# Patient Record
Sex: Female | Born: 1960 | Race: White | Hispanic: No | Marital: Married
Health system: Southern US, Community
[De-identification: ages and names within clinical notes are randomized; demographics above are authoritative.]

---

## 2001-01-26 ENCOUNTER — Other Ambulatory Visit: Admission: RE | Admit: 2001-01-26 | Discharge: 2001-01-26 | Payer: Self-pay | Admitting: Emergency Medicine

## 2001-10-11 ENCOUNTER — Encounter: Admission: RE | Admit: 2001-10-11 | Discharge: 2001-10-11 | Payer: Self-pay | Admitting: Family Medicine

## 2001-10-11 ENCOUNTER — Encounter: Payer: Self-pay | Admitting: Family Medicine

## 2003-01-11 ENCOUNTER — Other Ambulatory Visit: Admission: RE | Admit: 2003-01-11 | Discharge: 2003-01-11 | Payer: Self-pay | Admitting: Family Medicine

## 2003-07-14 ENCOUNTER — Ambulatory Visit (HOSPITAL_COMMUNITY): Admission: RE | Admit: 2003-07-14 | Discharge: 2003-07-14 | Payer: Self-pay | Admitting: Family Medicine

## 2005-12-16 ENCOUNTER — Other Ambulatory Visit: Admission: RE | Admit: 2005-12-16 | Discharge: 2005-12-16 | Payer: Self-pay | Admitting: Family Medicine

## 2006-06-01 ENCOUNTER — Ambulatory Visit (HOSPITAL_COMMUNITY): Admission: RE | Admit: 2006-06-01 | Discharge: 2006-06-01 | Payer: Self-pay | Admitting: Family Medicine

## 2007-10-20 ENCOUNTER — Ambulatory Visit (HOSPITAL_COMMUNITY): Admission: RE | Admit: 2007-10-20 | Discharge: 2007-10-20 | Payer: Self-pay | Admitting: Family Medicine

## 2007-12-08 ENCOUNTER — Other Ambulatory Visit: Admission: RE | Admit: 2007-12-08 | Discharge: 2007-12-08 | Payer: Self-pay | Admitting: Family Medicine

## 2009-05-18 ENCOUNTER — Other Ambulatory Visit: Admission: RE | Admit: 2009-05-18 | Discharge: 2009-05-18 | Payer: Self-pay | Admitting: Family Medicine

## 2010-05-22 ENCOUNTER — Ambulatory Visit (HOSPITAL_COMMUNITY): Admission: RE | Admit: 2010-05-22 | Discharge: 2010-05-22 | Payer: Self-pay | Admitting: Family Medicine

## 2010-07-09 ENCOUNTER — Other Ambulatory Visit
Admission: RE | Admit: 2010-07-09 | Discharge: 2010-07-09 | Payer: Self-pay | Source: Home / Self Care | Admitting: Family Medicine

## 2011-06-09 ENCOUNTER — Other Ambulatory Visit (HOSPITAL_COMMUNITY): Payer: Self-pay | Admitting: Family Medicine

## 2011-06-09 DIAGNOSIS — Z1231 Encounter for screening mammogram for malignant neoplasm of breast: Secondary | ICD-10-CM

## 2011-07-11 ENCOUNTER — Ambulatory Visit (HOSPITAL_COMMUNITY)
Admission: RE | Admit: 2011-07-11 | Discharge: 2011-07-11 | Disposition: A | Discharge status: Home / Self Care | Payer: 59 | Source: Ambulatory Visit | Attending: Family Medicine | Admitting: Family Medicine

## 2011-07-11 DIAGNOSIS — Z1231 Encounter for screening mammogram for malignant neoplasm of breast: Secondary | ICD-10-CM | POA: Insufficient documentation

## 2012-08-06 ENCOUNTER — Other Ambulatory Visit (HOSPITAL_COMMUNITY)
Admission: RE | Admit: 2012-08-06 | Discharge: 2012-08-06 | Disposition: A | Discharge status: Home / Self Care | Payer: 59 | Source: Ambulatory Visit | Attending: Family Medicine | Admitting: Family Medicine

## 2012-08-06 ENCOUNTER — Other Ambulatory Visit: Payer: Self-pay | Admitting: Family Medicine

## 2012-08-06 DIAGNOSIS — Z124 Encounter for screening for malignant neoplasm of cervix: Secondary | ICD-10-CM | POA: Insufficient documentation

## 2012-11-29 IMAGING — MG MM DIGITAL SCREENING BILAT
4 series · 4 of 4 positions shown · non-contrast
Comparison: Prior studies.

DG SCREEN MAMMOGRAM BILATERAL
Bilateral CC and MLO view(s) were taken.

DIGITAL SCREENING MAMMOGRAM WITH CAD:

[R CC]
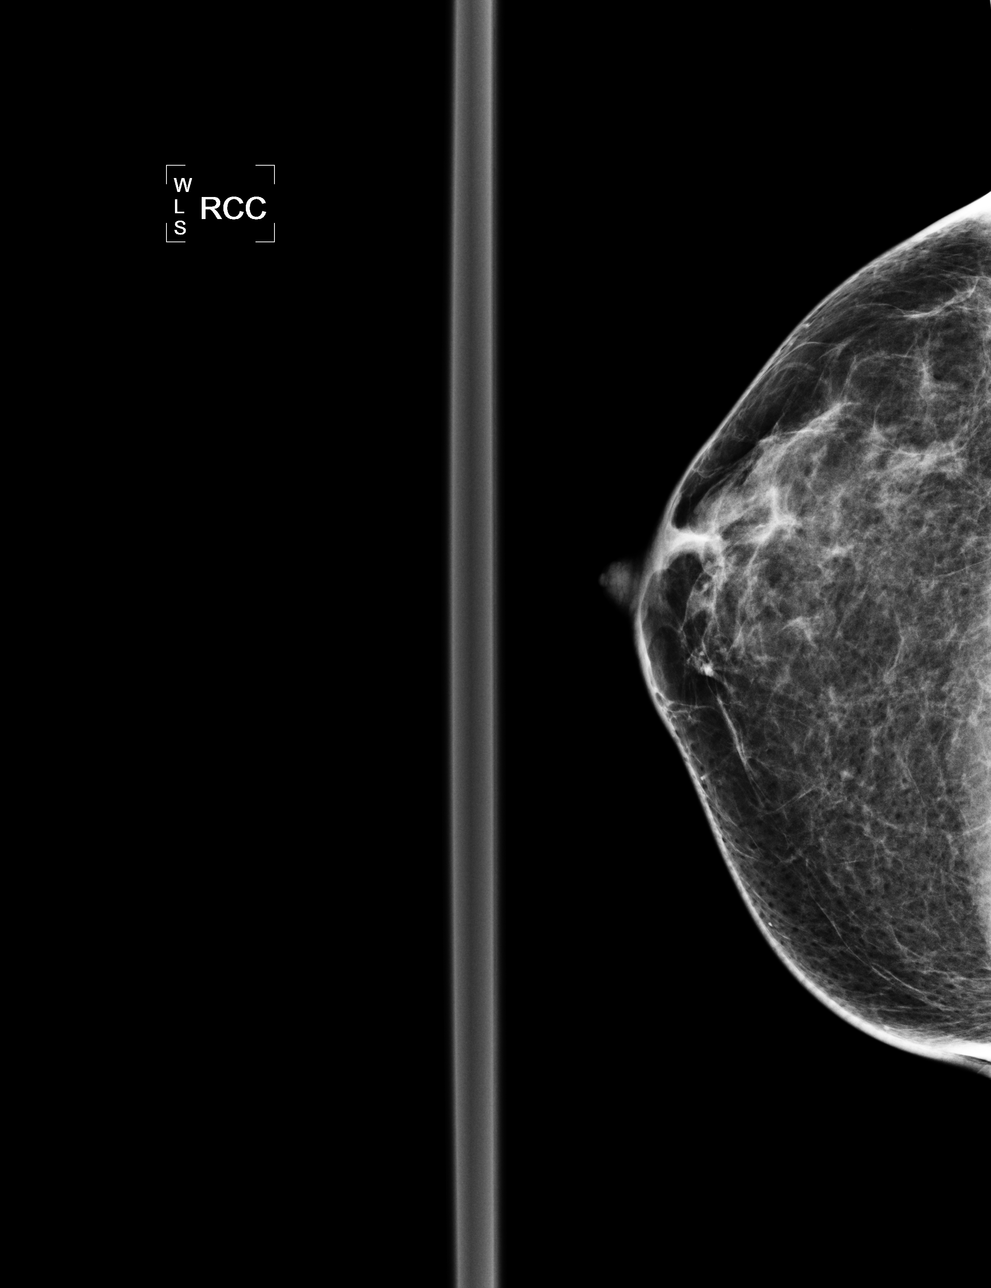

[R MLO]
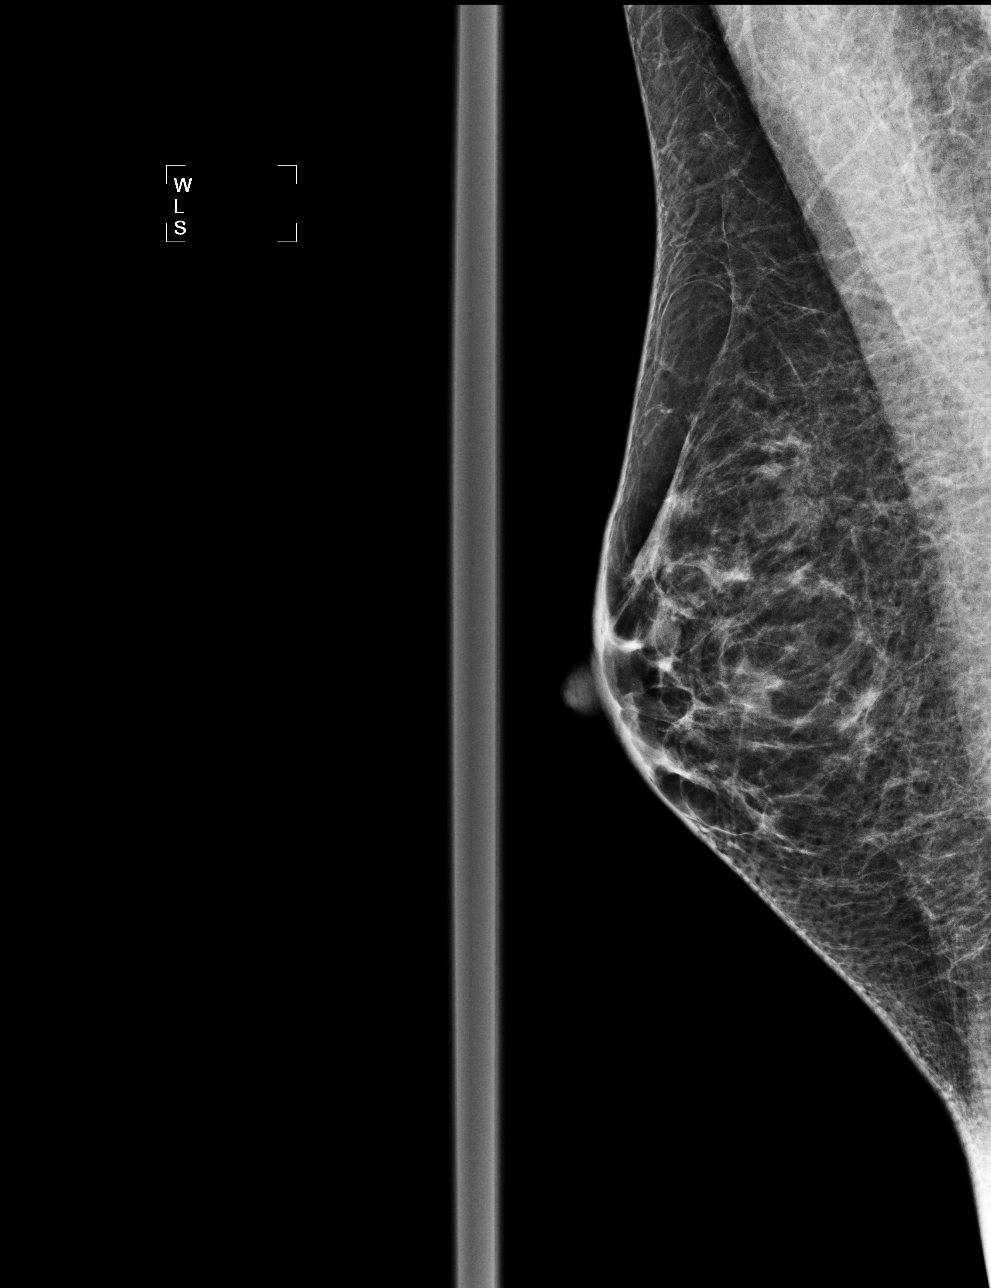

[L CC]
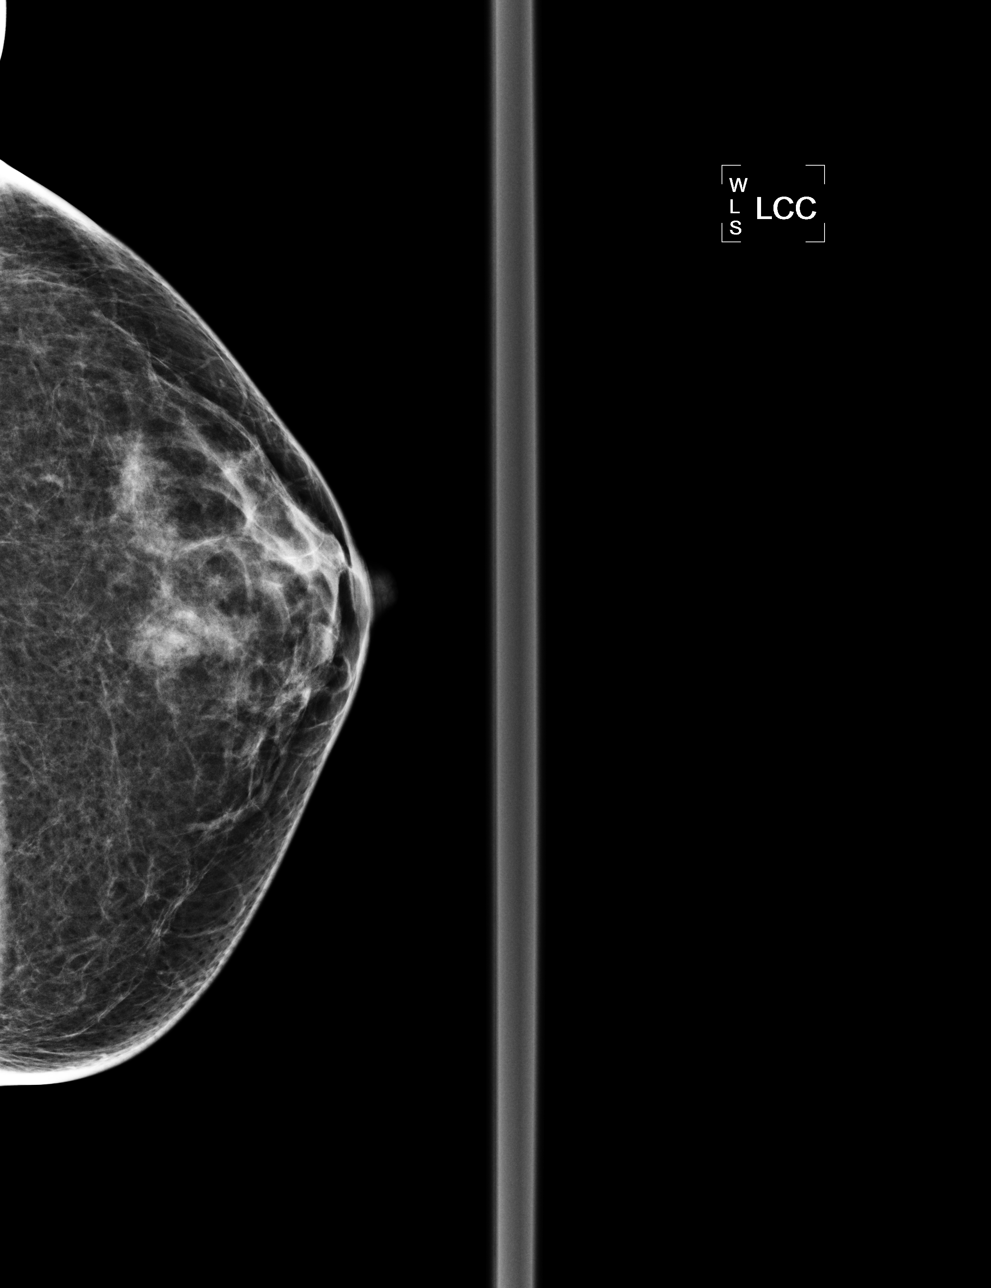

[L MLO]
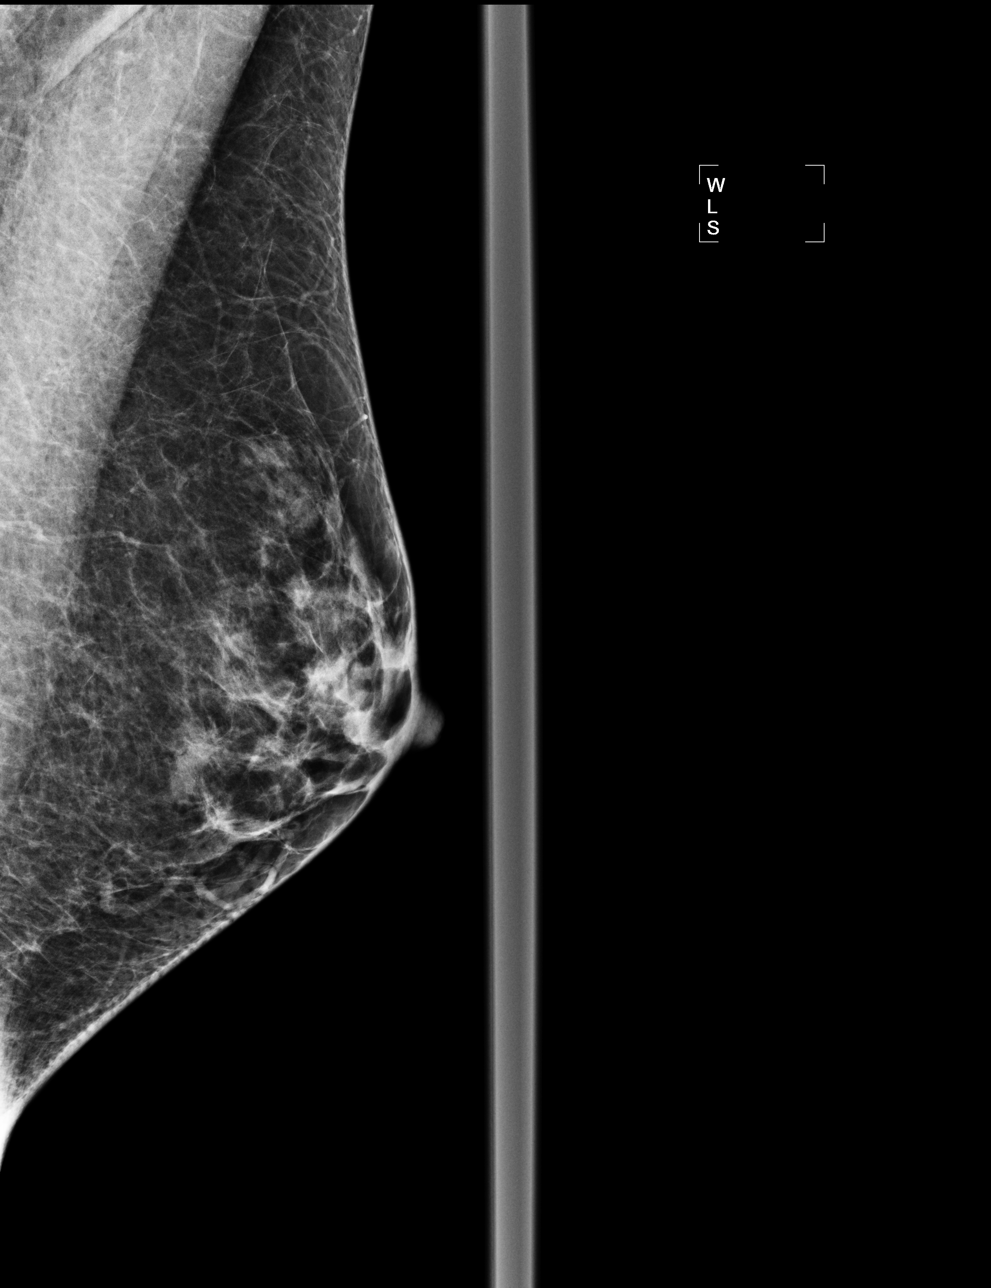

[4 of 4 positions shown; findings below may reference images not displayed]

There are scattered fibroglandular densities.  There is no dominant mass, architectural distortion 
or calcification to suggest malignancy.

Images were processed with CAD.
IMPRESSION: No mammographic evidence of malignancy.  Suggest yearly screening mammography.

A result letter of this screening mammogram will be mailed directly to the patient.

ASSESSMENT: Negative - BI-RADS 1

Screening mammogram in 1 year.
,

## 2013-10-12 ENCOUNTER — Other Ambulatory Visit (HOSPITAL_COMMUNITY): Payer: Self-pay | Admitting: Family Medicine

## 2013-10-12 DIAGNOSIS — Z1231 Encounter for screening mammogram for malignant neoplasm of breast: Secondary | ICD-10-CM

## 2013-10-14 ENCOUNTER — Ambulatory Visit (HOSPITAL_COMMUNITY)
Admission: RE | Admit: 2013-10-14 | Discharge: 2013-10-14 | Disposition: A | Discharge status: Home / Self Care | Payer: 59 | Source: Ambulatory Visit | Attending: Family Medicine | Admitting: Family Medicine

## 2013-10-14 DIAGNOSIS — Z1231 Encounter for screening mammogram for malignant neoplasm of breast: Secondary | ICD-10-CM | POA: Insufficient documentation

## 2015-02-27 ENCOUNTER — Other Ambulatory Visit (HOSPITAL_COMMUNITY): Payer: Self-pay | Admitting: Family Medicine

## 2015-02-27 DIAGNOSIS — Z1231 Encounter for screening mammogram for malignant neoplasm of breast: Secondary | ICD-10-CM

## 2015-03-06 ENCOUNTER — Ambulatory Visit (HOSPITAL_COMMUNITY)
Admission: RE | Admit: 2015-03-06 | Discharge: 2015-03-06 | Disposition: A | Discharge status: Home / Self Care | Payer: 59 | Source: Ambulatory Visit | Attending: Family Medicine | Admitting: Family Medicine

## 2015-03-06 ENCOUNTER — Other Ambulatory Visit (HOSPITAL_COMMUNITY): Payer: Self-pay | Admitting: Family Medicine

## 2015-03-06 DIAGNOSIS — Z1231 Encounter for screening mammogram for malignant neoplasm of breast: Secondary | ICD-10-CM

## 2015-09-26 ENCOUNTER — Other Ambulatory Visit (HOSPITAL_COMMUNITY)
Admission: RE | Admit: 2015-09-26 | Discharge: 2015-09-26 | Disposition: A | Discharge status: Home / Self Care | Payer: Managed Care, Other (non HMO) | Source: Ambulatory Visit | Attending: Family Medicine | Admitting: Family Medicine

## 2015-09-26 ENCOUNTER — Other Ambulatory Visit: Payer: Self-pay | Admitting: Family Medicine

## 2015-09-26 DIAGNOSIS — Z124 Encounter for screening for malignant neoplasm of cervix: Secondary | ICD-10-CM | POA: Diagnosis not present

## 2015-09-27 LAB — CYTOLOGY - PAP

## 2016-07-14 ENCOUNTER — Other Ambulatory Visit: Payer: Self-pay | Admitting: Family Medicine

## 2016-07-14 DIAGNOSIS — Z1231 Encounter for screening mammogram for malignant neoplasm of breast: Secondary | ICD-10-CM

## 2016-08-01 ENCOUNTER — Ambulatory Visit
Admission: RE | Admit: 2016-08-01 | Discharge: 2016-08-01 | Disposition: A | Discharge status: Home / Self Care | Payer: Managed Care, Other (non HMO) | Source: Ambulatory Visit | Attending: Family Medicine | Admitting: Family Medicine

## 2016-08-01 DIAGNOSIS — Z1231 Encounter for screening mammogram for malignant neoplasm of breast: Secondary | ICD-10-CM

## 2018-02-09 ENCOUNTER — Other Ambulatory Visit: Payer: Self-pay | Admitting: Family Medicine

## 2018-02-09 DIAGNOSIS — Z1231 Encounter for screening mammogram for malignant neoplasm of breast: Secondary | ICD-10-CM

## 2018-02-16 ENCOUNTER — Ambulatory Visit
Admission: RE | Admit: 2018-02-16 | Discharge: 2018-02-16 | Disposition: A | Discharge status: Home / Self Care | Payer: Managed Care, Other (non HMO) | Source: Ambulatory Visit | Attending: Family Medicine | Admitting: Family Medicine

## 2018-02-16 DIAGNOSIS — Z1231 Encounter for screening mammogram for malignant neoplasm of breast: Secondary | ICD-10-CM

## 2018-03-06 ENCOUNTER — Other Ambulatory Visit: Payer: Self-pay | Admitting: Family Medicine

## 2018-03-06 DIAGNOSIS — N9489 Other specified conditions associated with female genital organs and menstrual cycle: Secondary | ICD-10-CM

## 2018-03-11 ENCOUNTER — Ambulatory Visit
Admission: RE | Admit: 2018-03-11 | Discharge: 2018-03-11 | Disposition: A | Discharge status: Home / Self Care | Payer: 59 | Source: Ambulatory Visit | Attending: Family Medicine | Admitting: Family Medicine

## 2018-03-11 DIAGNOSIS — N9489 Other specified conditions associated with female genital organs and menstrual cycle: Secondary | ICD-10-CM

## 2019-04-12 ENCOUNTER — Other Ambulatory Visit: Payer: Self-pay | Admitting: Family Medicine

## 2019-04-12 DIAGNOSIS — Z1231 Encounter for screening mammogram for malignant neoplasm of breast: Secondary | ICD-10-CM

## 2019-05-05 ENCOUNTER — Other Ambulatory Visit: Payer: Self-pay | Admitting: Family Medicine

## 2019-05-05 ENCOUNTER — Other Ambulatory Visit (HOSPITAL_COMMUNITY)
Admission: RE | Admit: 2019-05-05 | Discharge: 2019-05-05 | Disposition: A | Discharge status: Home / Self Care | Payer: 59 | Source: Ambulatory Visit | Attending: Family Medicine | Admitting: Family Medicine

## 2019-05-05 DIAGNOSIS — Z124 Encounter for screening for malignant neoplasm of cervix: Secondary | ICD-10-CM | POA: Insufficient documentation

## 2019-05-09 LAB — CYTOLOGY - PAP
Comment: NEGATIVE
Diagnosis: NEGATIVE
High risk HPV: NEGATIVE

## 2019-05-25 ENCOUNTER — Ambulatory Visit
Admission: RE | Admit: 2019-05-25 | Discharge: 2019-05-25 | Disposition: A | Discharge status: Home / Self Care | Payer: 59 | Source: Ambulatory Visit | Attending: Family Medicine | Admitting: Family Medicine

## 2019-05-25 ENCOUNTER — Other Ambulatory Visit: Payer: Self-pay

## 2019-05-25 DIAGNOSIS — Z1231 Encounter for screening mammogram for malignant neoplasm of breast: Secondary | ICD-10-CM

## 2019-09-01 ENCOUNTER — Ambulatory Visit (INDEPENDENT_AMBULATORY_CARE_PROVIDER_SITE_OTHER): Payer: 59 | Admitting: Podiatry

## 2019-09-01 ENCOUNTER — Other Ambulatory Visit: Payer: Self-pay

## 2019-09-01 ENCOUNTER — Encounter: Payer: Self-pay | Admitting: Podiatry

## 2019-09-01 VITALS — BP 139/84 | HR 68 | Temp 97.2°F | Resp 16

## 2019-09-01 DIAGNOSIS — B070 Plantar wart: Secondary | ICD-10-CM | POA: Diagnosis not present

## 2019-09-01 NOTE — Patient Instructions (Signed)
Take dressing off in 8 hours and wash the foot with soap and water. If it is hurting or becomes uncomfortable before the 8 hours, go ahead and remove the bandage and wash the area.  If it blisters, apply antibiotic ointment and a band-aid.  Monitor for any signs/symptoms of infection. Call the office immediately if any occur or go directly to the emergency room. Call with any questions/concerns.   

## 2019-09-12 DIAGNOSIS — B070 Plantar wart: Secondary | ICD-10-CM | POA: Insufficient documentation

## 2019-09-12 NOTE — Progress Notes (Signed)
Subjective:   Patient ID: Tracy Duran, female   DOB: 59 y.o.   MRN: [id number]   HPI 59 year old female presents the office today for concerns of possible warts on her right foot which is been on the last 6 months.  She states it feels like she is walking on a rock.  She has had no recent treatment for the area.  Denies any swelling, redness.  No drainage.   Review of Systems  All other systems reviewed and are negative.  History reviewed. No pertinent past medical history.  History reviewed. No pertinent surgical history.  No current outpatient medications on file.  No Known Allergies  Social History   Socioeconomic History  . Marital status: Married    Spouse name: Not on file  . Number of children: Not on file  . Years of education: Not on file  . Highest education level: Not on file  Occupational History  . Not on file  Tobacco Use  . Smoking status: Unknown If Ever Smoked  . Smokeless tobacco: Never Used  Substance and Sexual Activity  . Alcohol use: Not on file  . Drug use: Not on file  . Sexual activity: Not on file  Other Topics Concern  . Not on file  Social History Narrative  . Not on file   Social Determinants of Health   Financial Resource Strain:   . Difficulty of Paying Living Expenses:   Food Insecurity:   . Worried About Charity fundraiser in the Last Year:   . Arboriculturist in the Last Year:   Transportation Needs:   . Film/video editor (Medical):   Marland Kitchen Lack of Transportation (Non-Medical):   Physical Activity:   . Days of Exercise per Week:   . Minutes of Exercise per Session:   Stress:   . Feeling of Stress :   Social Connections:   . Frequency of Communication with Friends and Family:   . Frequency of Social Gatherings with Friends and Family:   . Attends Religious Services:   . Active Member of Clubs or Organizations:   . Attends Archivist Meetings:   Marland Kitchen Marital Status:   Intimate Partner Violence:   . Fear of  Current or Ex-Partner:   . Emotionally Abused:   Marland Kitchen Physically Abused:   . Sexually Abused:        Objective:  Physical Exam  General: AAO x3, NAD  Dermatological: On the right foot submetatarsal areas hyperkeratotic lesion upon debridement appears to be a verruca.  There is no drainage or pus there is no surrounding erythema, ascending cellulitis.  No fluctuation crepitation.  There is no open lesions otherwise.  Vascular: Dorsalis Pedis artery and Posterior Tibial artery pedal pulses are 2/4 bilateral with immedate capillary fill time. There is no pain with calf compression, swelling, warmth, erythema.   Neruologic: Grossly intact via light touch bilateral.   Musculoskeletal: No gross boney pedal deformities bilateral. No pain, crepitus, or limitation noted with foot and ankle range of motion bilateral. Muscular strength 5/5 in all groups tested bilateral.  Gait: Unassisted, Nonantalgic.     Assessment:   Right foot verruca     Plan:  -Treatment options discussed including all alternatives, risks, and complications -Etiology of symptoms were discussed -Lesion was debrided utilizing the 312 with scalpel without any complications.  The area skin with alcohol and Cantharone was applied followed by an occlusive bandage.  Post procedure instructions discussed.  Monitor for any signs  or symptoms of infection.   Vivi Barrack DPM

## 2020-08-16 ENCOUNTER — Other Ambulatory Visit: Payer: Self-pay | Admitting: Family Medicine

## 2020-08-16 DIAGNOSIS — Z1231 Encounter for screening mammogram for malignant neoplasm of breast: Secondary | ICD-10-CM

## 2020-10-04 ENCOUNTER — Other Ambulatory Visit: Payer: Self-pay

## 2020-10-04 ENCOUNTER — Ambulatory Visit
Admission: RE | Admit: 2020-10-04 | Discharge: 2020-10-04 | Disposition: A | Discharge status: Home / Self Care | Payer: 59 | Source: Ambulatory Visit | Attending: Family Medicine | Admitting: Family Medicine

## 2020-10-04 DIAGNOSIS — Z1231 Encounter for screening mammogram for malignant neoplasm of breast: Secondary | ICD-10-CM

## 2021-06-06 ENCOUNTER — Other Ambulatory Visit: Payer: Self-pay | Admitting: Family Medicine

## 2021-06-06 DIAGNOSIS — Z1231 Encounter for screening mammogram for malignant neoplasm of breast: Secondary | ICD-10-CM

## 2021-06-06 DIAGNOSIS — E2839 Other primary ovarian failure: Secondary | ICD-10-CM

## 2021-11-20 ENCOUNTER — Ambulatory Visit
Admission: RE | Admit: 2021-11-20 | Discharge: 2021-11-20 | Disposition: A | Discharge status: Home / Self Care | Payer: 59 | Source: Ambulatory Visit | Attending: Family Medicine | Admitting: Family Medicine

## 2021-11-20 ENCOUNTER — Ambulatory Visit: Payer: 59

## 2021-11-20 DIAGNOSIS — E2839 Other primary ovarian failure: Secondary | ICD-10-CM

## 2021-11-20 DIAGNOSIS — Z1231 Encounter for screening mammogram for malignant neoplasm of breast: Secondary | ICD-10-CM

## 2021-11-22 ENCOUNTER — Other Ambulatory Visit: Payer: Self-pay | Admitting: Family Medicine

## 2021-11-22 DIAGNOSIS — R928 Other abnormal and inconclusive findings on diagnostic imaging of breast: Secondary | ICD-10-CM

## 2021-11-29 ENCOUNTER — Ambulatory Visit
Admission: RE | Admit: 2021-11-29 | Discharge: 2021-11-29 | Disposition: A | Discharge status: Home / Self Care | Payer: 59 | Source: Ambulatory Visit | Attending: Family Medicine | Admitting: Family Medicine

## 2021-11-29 DIAGNOSIS — R928 Other abnormal and inconclusive findings on diagnostic imaging of breast: Secondary | ICD-10-CM

## 2022-03-10 ENCOUNTER — Other Ambulatory Visit: Payer: Self-pay | Admitting: Family Medicine

## 2022-03-10 DIAGNOSIS — N6001 Solitary cyst of right breast: Secondary | ICD-10-CM

## 2022-03-10 DIAGNOSIS — N6310 Unspecified lump in the right breast, unspecified quadrant: Secondary | ICD-10-CM

## 2022-06-06 ENCOUNTER — Ambulatory Visit
Admission: RE | Admit: 2022-06-06 | Discharge: 2022-06-06 | Disposition: A | Discharge status: Home / Self Care | Payer: 59 | Source: Ambulatory Visit | Attending: Family Medicine | Admitting: Family Medicine

## 2022-06-06 ENCOUNTER — Other Ambulatory Visit: Payer: Self-pay | Admitting: Family Medicine

## 2022-06-06 DIAGNOSIS — N6310 Unspecified lump in the right breast, unspecified quadrant: Secondary | ICD-10-CM

## 2022-12-09 ENCOUNTER — Ambulatory Visit
Admission: RE | Admit: 2022-12-09 | Discharge: 2022-12-09 | Disposition: A | Discharge status: Home / Self Care | Payer: 59 | Source: Ambulatory Visit | Attending: Family Medicine | Admitting: Family Medicine

## 2022-12-09 DIAGNOSIS — N6310 Unspecified lump in the right breast, unspecified quadrant: Secondary | ICD-10-CM

## 2023-07-07 ENCOUNTER — Other Ambulatory Visit: Payer: Self-pay | Admitting: Family Medicine

## 2023-07-07 DIAGNOSIS — M85832 Other specified disorders of bone density and structure, left forearm: Secondary | ICD-10-CM

## 2023-07-10 ENCOUNTER — Other Ambulatory Visit: Payer: Self-pay | Admitting: Family Medicine

## 2023-07-10 DIAGNOSIS — N63 Unspecified lump in unspecified breast: Secondary | ICD-10-CM

## 2023-12-10 ENCOUNTER — Other Ambulatory Visit: Payer: 59

## 2023-12-24 ENCOUNTER — Ambulatory Visit
Admission: RE | Admit: 2023-12-24 | Discharge: 2023-12-24 | Disposition: A | Discharge status: Home / Self Care | Payer: 59 | Source: Ambulatory Visit | Attending: Family Medicine | Admitting: Family Medicine

## 2023-12-24 DIAGNOSIS — N63 Unspecified lump in unspecified breast: Secondary | ICD-10-CM

## 2024-02-08 ENCOUNTER — Ambulatory Visit (HOSPITAL_BASED_OUTPATIENT_CLINIC_OR_DEPARTMENT_OTHER)
Admission: RE | Admit: 2024-02-08 | Discharge: 2024-02-08 | Disposition: A | Discharge status: Home / Self Care | Source: Ambulatory Visit | Attending: Family Medicine | Admitting: Family Medicine

## 2024-02-08 DIAGNOSIS — M85832 Other specified disorders of bone density and structure, left forearm: Secondary | ICD-10-CM | POA: Insufficient documentation

## 2024-03-07 ENCOUNTER — Other Ambulatory Visit: Payer: 59
